# Patient Record
Sex: Male | Born: 2000 | Hispanic: No | Marital: Single | Smoking: Never smoker
Health system: Southern US, Community
[De-identification: ages and names within clinical notes are randomized; demographics above are authoritative.]

---

## 2020-11-08 ENCOUNTER — Ambulatory Visit
Admission: RE | Admit: 2020-11-08 | Discharge: 2020-11-08 | Disposition: A | Discharge status: Home / Self Care | Payer: TRICARE For Life (TFL) | Source: Ambulatory Visit | Attending: Student | Admitting: Student

## 2020-11-08 ENCOUNTER — Other Ambulatory Visit: Payer: Self-pay

## 2020-11-08 ENCOUNTER — Ambulatory Visit (INDEPENDENT_AMBULATORY_CARE_PROVIDER_SITE_OTHER): Payer: TRICARE For Life (TFL)

## 2020-11-08 VITALS — BP 154/91 | HR 88 | Temp 98.0°F | Resp 22

## 2020-11-08 DIAGNOSIS — M25532 Pain in left wrist: Secondary | ICD-10-CM | POA: Diagnosis not present

## 2020-11-08 DIAGNOSIS — S66912A Strain of unspecified muscle, fascia and tendon at wrist and hand level, left hand, initial encounter: Secondary | ICD-10-CM

## 2020-11-08 MED ORDER — PREDNISONE 20 MG PO TABS: 40.0000 mg | ORAL_TABLET | Freq: Every day | ORAL | 0 refills | Status: AC

## 2020-11-08 NOTE — ED Provider Notes (Signed)
[facility]    CSN: [id number] Arrival date & time: 11/08/20  1244      History   Chief Complaint Chief Complaint  Patient presents with   Wrist Pain    HPI Patrick Bernard is a 21 y.o. male presenting with L wrist injury that occurred 6 weeks ago while playing volleyball. Medical history noncontributory. States following missing a ball he punched a wall. Pain to the lateral L wrist x6 weeks. Has been using wrist brace with minimal improvement. Denies sensation changes. Denies pain or injury elsewhere.  HPI  History reviewed. No pertinent past medical history.  There are no problems to display for this patient.   History reviewed. No pertinent surgical history.     Home Medications    Prior to Admission medications   Medication Sig Start Date End Date Taking? Authorizing Provider  predniSONE (DELTASONE) 20 MG tablet Take 2 tablets (40 mg total) by mouth daily for 5 days. 11/08/20 11/13/20 Yes Rhys Martini, PA-C    Family History Family History  Problem Relation Age of Onset   Healthy Mother    Healthy Father     Social History Social History   Tobacco Use   Smoking status: Never   Smokeless tobacco: Never  Substance Use Topics   Alcohol use: Never   Drug use: Never     Allergies   Patient has no known allergies.   Review of Systems Review of Systems  Musculoskeletal:        L wrist pain  All other systems reviewed and are negative.   Physical Exam Triage Vital Signs ED Triage Vitals [11/08/20 1302]  Enc Vitals Group     BP (!) 154/91     Pulse Rate 88     Resp (!) 22     Temp 98 F (36.7 C)     Temp Source Oral     SpO2 98 %     Weight      Height      Head Circumference      Peak Flow      Pain Score 7     Pain Loc      Pain Edu?      Excl. in GC?    No data found.  Updated Vital Signs BP (!) 154/91   Pulse 88   Temp 98 F (36.7 C) (Oral)   Resp (!) 22   SpO2 98%   Visual Acuity Right Eye Distance:   Left  Eye Distance:   Bilateral Distance:    Right Eye Near:   Left Eye Near:    Bilateral Near:     Physical Exam Vitals reviewed.  Constitutional:      Appearance: Normal appearance.  HENT:     Head: Normocephalic and atraumatic.  Cardiovascular:     Rate and Rhythm: Normal rate.     Pulses: Normal pulses.  Musculoskeletal:     Comments: No L wrist pain to palpation, no abnormality of radius or ulna. No metatarsal tenderness, no snuffbox tenderness. Grip strength 5/5, sensation intact, cap refill <2 seconds, radial pulse 2+. Pain elicited with extension and flexion wrist.   Skin:    Capillary Refill: Capillary refill takes less than 2 seconds.  Neurological:     General: No focal deficit present.     Mental Status: He is alert and oriented to person, place, and time.  Psychiatric:        Mood and Affect: Mood normal.  Behavior: Behavior normal.        Thought Content: Thought content normal.        Judgment: Judgment normal.     UC Treatments / Results  Labs (all labs ordered are listed, but only abnormal results are displayed) Labs Reviewed - No data to display  EKG   Radiology DG Wrist Complete Left  Result Date: 11/08/2020 CLINICAL DATA:  Injury 6 weeks ago with continued left wrist pain EXAM: LEFT WRIST - COMPLETE 3+ VIEW COMPARISON:  None. FINDINGS: There is no evidence of acute or healing fracture. No dislocation. There is no evidence of arthropathy or other focal bone abnormality. Soft tissues are unremarkable. IMPRESSION: Negative. Electronically Signed   By: Duanne Guess D.O.   On: 11/08/2020 13:43    Procedures Procedures (including critical care time)  Medications Ordered in UC Medications - No data to display  Initial Impression / Assessment and Plan / UC Course  I have reviewed the triage vital signs and the nursing notes.  Pertinent labs & imaging results that were available during my care of the patient were reviewed by me and considered in my  medical decision making (see chart for details).     This patient is a very pleasant 20 y.o. year old male presenting with L wrist pain x6 weeks following punching wall. Neurovascularly intact. Xray L wrist- negative. Has been using wrist brace, continue this. Short course of prednisone.  F/u with ortho.  ED return precautions discussed. Patient verbalizes understanding and agreement.   Final Clinical Impressions(s) / UC Diagnoses   Final diagnoses:  Strain of left wrist, initial encounter     Discharge Instructions      -Prednisone, 2 pills taken at the same time for 5 days in a row.  Try taking this earlier in the day as it can give you energy. Avoid NSAIDs like ibuprofen and alleve while taking this medication as they can increase your risk of stomach upset and even GI bleeding when in combination with a steroid. You can continue tylenol (acetaminophen) up to 1000mg  3x daily. -Tylenol for additional relief -Continue using the wrist brace throughout the day and when you're having pain -Follow-up with [facility] if symptoms worsen or persist, in about 5 days you can schedule an appointment with them online or by calling.  He can also go to their walk-in clinic Monday through Friday.     ED Prescriptions     Medication Sig Dispense Auth. Provider   predniSONE (DELTASONE) 20 MG tablet Take 2 tablets (40 mg total) by mouth daily for 5 days. 10 tablet Sunday, PA-C      PDMP not reviewed this encounter.   Rhys Martini, PA-C 11/08/20 [phone]

## 2020-11-08 NOTE — Discharge Instructions (Addendum)
-  Prednisone, 2 pills taken at the same time for 5 days in a row.  Try taking this earlier in the day as it can give you energy. Avoid NSAIDs like ibuprofen and alleve while taking this medication as they can increase your risk of stomach upset and even GI bleeding when in combination with a steroid. You can continue tylenol (acetaminophen) up to 1000mg  3x daily. -Tylenol for additional relief -Continue using the wrist brace throughout the day and when you're having pain -Follow-up with [facility] if symptoms worsen or persist, in about 5 days you can schedule an appointment with them online or by calling.  He can also go to their walk-in clinic Monday through Friday.

## 2020-11-08 NOTE — ED Triage Notes (Signed)
Pt here with left injury 6 weeks ago while playing volleyball. Only hurts with movement. Hs not gotten any better despite wearing a brace.

## 2021-12-03 IMAGING — DX DG WRIST COMPLETE 3+V*L*
2 series · 2 of 2 positions shown · non-contrast
Comparison: None.

CLINICAL DATA: Injury 6 weeks ago with continued left wrist pain

EXAM:
LEFT WRIST - COMPLETE 3+ VIEW

[wrist pa (1 of 2)]
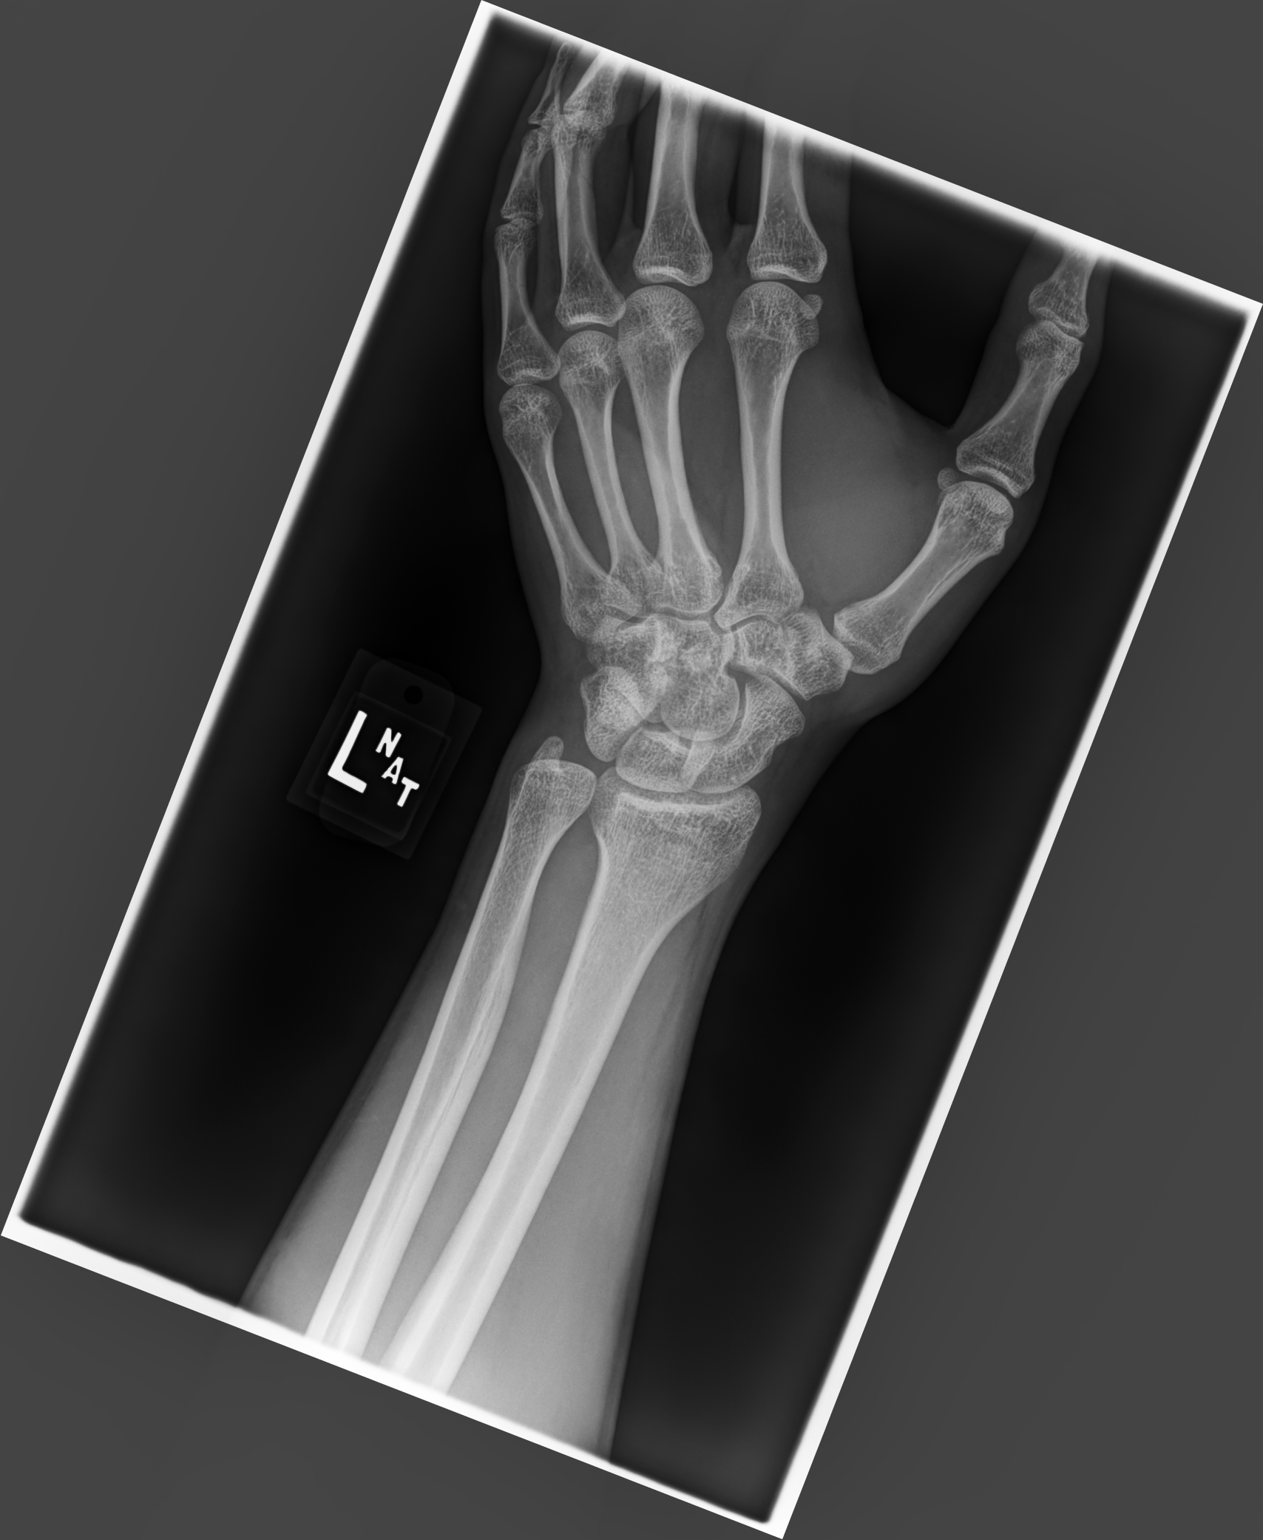

[wrist pa (2 of 2)]
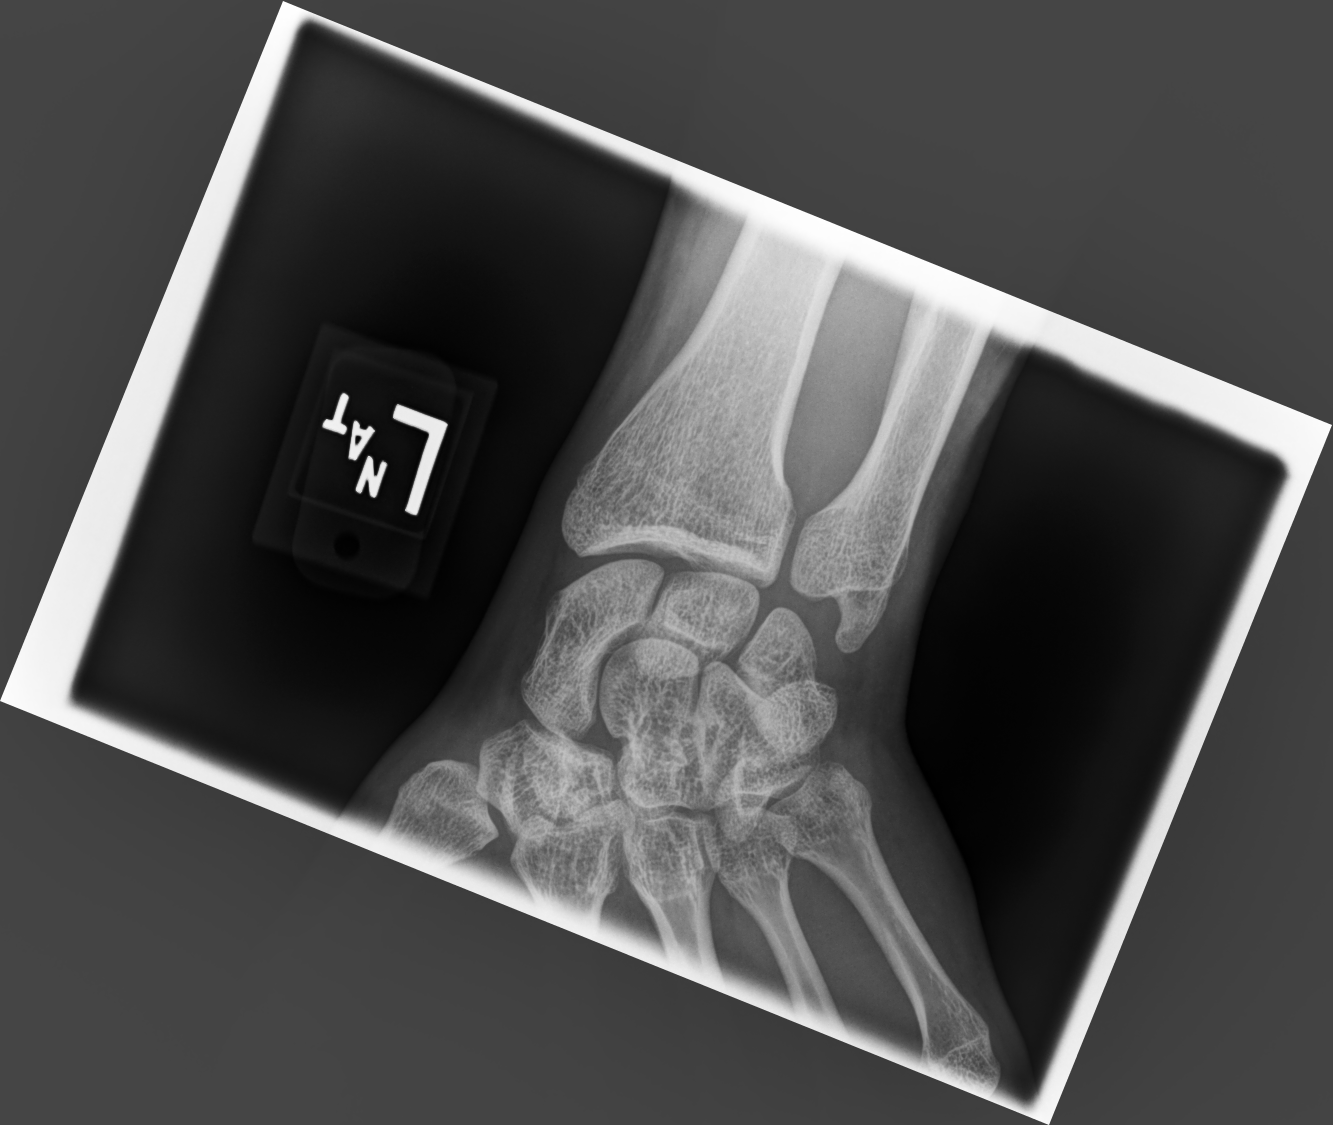

[2 of 2 positions shown; findings below may reference images not displayed]

FINDINGS: There is no evidence of acute or healing fracture. No dislocation.
There is no evidence of arthropathy or other focal bone abnormality.
Soft tissues are unremarkable.
IMPRESSION: Negative.
